# Patient Record
Sex: Female | Born: 1935 | Race: Black or African American | Hispanic: No | Marital: Single | Smoking: Never smoker
Health system: Southern US, Community
[De-identification: ages and names within clinical notes are randomized; demographics above are authoritative.]

---

## 2018-08-21 ENCOUNTER — Emergency Department (INDEPENDENT_AMBULATORY_CARE_PROVIDER_SITE_OTHER): Payer: Medicare Other

## 2018-08-21 ENCOUNTER — Emergency Department
Admission: EM | Admit: 2018-08-21 | Discharge: 2018-08-21 | Disposition: A | Discharge status: Home / Self Care | Payer: Medicare Other | Source: Home / Self Care | Attending: Family Medicine | Admitting: Family Medicine

## 2018-08-21 ENCOUNTER — Other Ambulatory Visit: Payer: Self-pay

## 2018-08-21 DIAGNOSIS — S92422A Displaced fracture of distal phalanx of left great toe, initial encounter for closed fracture: Secondary | ICD-10-CM | POA: Diagnosis not present

## 2018-08-21 DIAGNOSIS — X58XXXA Exposure to other specified factors, initial encounter: Secondary | ICD-10-CM | POA: Diagnosis not present

## 2018-08-21 DIAGNOSIS — S92425A Nondisplaced fracture of distal phalanx of left great toe, initial encounter for closed fracture: Secondary | ICD-10-CM

## 2018-08-21 MED ORDER — DOXYCYCLINE HYCLATE 100 MG PO CAPS: 100.0000 mg | ORAL_CAPSULE | Freq: Two times a day (BID) | ORAL | 0 refills | Status: AC

## 2018-08-21 MED ORDER — HYDROCODONE-ACETAMINOPHEN 5-325 MG PO TABS: ORAL_TABLET | ORAL | 0 refills | Status: AC

## 2018-08-21 NOTE — ED Provider Notes (Signed)
[facility]    CSN: [id number] Arrival date & time: 08/21/18  1531     History   Chief Complaint Chief Complaint  Patient presents with  . Leg Pain  . Foot Pain    HPI Veronica Ramos is a 82 y.o. female.   Patient complains of swelling in her left great toe and believes that she may have unknowingly injured her toe.  She has peripheral neuropathy, and denies pain in her great toe but does have vague discomfort in her left foot which has kept her awake despite oxycodone.  The history is provided by the patient and a relative.  Leg Pain  Location:  Toe and foot Time since incident:  1 week Foot location:  L foot Toe location:  L great toe Pain details:    Quality:  Aching   Radiates to:  Does not radiate   Severity:  Mild   Onset quality:  Gradual   Duration:  1 week   Timing:  Constant   Progression:  Unchanged Chronicity:  New Dislocation: no   Prior injury to area:  No Relieved by:  None tried Ineffective treatments:  None tried Associated symptoms: numbness and swelling   Associated symptoms: no back pain, no decreased ROM, no fatigue, no fever, no itching, no muscle weakness, no stiffness and no tingling   Risk factors: obesity   Foot Pain  Pertinent negatives include no chest pain and no shortness of breath.    No past medical history on file.  There are no active problems to display for this patient.     OB History   None      Home Medications    Prior to Admission medications   Medication Sig Start Date End Date Taking? Authorizing Provider  amLODipine (NORVASC) 5 MG tablet Take 5 mg by mouth daily.   Yes [provider]  buPROPion (WELLBUTRIN) 100 MG tablet Take 100 mg by mouth 2 (two) times daily.   Yes [provider]  furosemide (LASIX) 40 MG tablet Take 40 mg by mouth daily.   Yes [provider]  gabapentin (NEURONTIN) 100 MG capsule Take 200 mg by mouth daily.   Yes [provider]    hydrochlorothiazide (HYDRODIURIL) 25 MG tablet Take 25 mg by mouth daily.   Yes [provider]  insulin aspart (NOVOLOG) 100 UNIT/ML injection Inject into the skin 3 (three) times daily before meals.   Yes [provider]  insulin aspart protamine- aspart (NOVOLOG MIX 70/30) (70-30) 100 UNIT/ML injection Inject 35 Units into the skin daily with breakfast.   Yes [provider]  levothyroxine (SYNTHROID, LEVOTHROID) 175 MCG tablet Take 175 mcg by mouth daily before breakfast.   Yes [provider]  loratadine (CLARITIN) 10 MG tablet Take 10 mg by mouth daily.   Yes [provider]  metoprolol succinate (TOPROL-XL) 100 MG 24 hr tablet Take 100 mg by mouth daily. Take with or immediately following a meal.   Yes [provider]  pantoprazole (PROTONIX) 40 MG tablet Take 40 mg by mouth daily.   Yes [provider]  potassium chloride (KLOR-CON) 20 MEQ packet Take 40 mEq by mouth daily.   Yes [provider]  Rivaroxaban (XARELTO) 15 MG TABS tablet Take 15 mg by mouth 2 (two) times daily with a meal.   Yes [provider]  simvastatin (ZOCOR) 20 MG tablet Take 20 mg by mouth daily.   Yes [provider]  venlafaxine [facility])  50 MG tablet Take 50 mg by mouth 2 (two) times daily.   Yes [provider]  doxycycline (VIBRAMYCIN) 100 MG capsule Take 1 capsule (100 mg total) by mouth 2 (two) times daily. Take with food. 08/21/18   Lattie Haw, MD  HYDROcodone-acetaminophen (NORCO/VICODIN) 5-325 MG tablet Take one by mouth at bedtime as needed for pain.  May repeat in 4 to 6 hours. 08/21/18   Lattie Haw, MD    Family History No family history on file.  Social History Social History   Tobacco Use  . Smoking status: Not on file  Substance Use Topics  . Alcohol use: Not on file  . Drug use: Not on file     Allergies   Patient has no known allergies.   Review of Systems Review of Systems   Constitutional: Negative for fatigue and fever.  Respiratory: Negative for cough, chest tightness, shortness of breath and wheezing.   Cardiovascular: Positive for leg swelling. Negative for chest pain and palpitations.  Musculoskeletal: Negative for back pain and stiffness.  Skin: Negative for itching.  All other systems reviewed and are negative.    Physical Exam Triage Vital Signs ED Triage Vitals  Enc Vitals Group     BP 08/21/18 1613 (!) 186/94     Pulse Rate 08/21/18 1613 95     Resp 08/21/18 1613 18     Temp 08/21/18 1613 98.3 F (36.8 C)     Temp Source 08/21/18 1613 Oral     SpO2 08/21/18 1613 96 %     Weight 08/21/18 1614 275 lb (124.7 kg)     Height 08/21/18 1614 5\' 8"  (1.727 m)     Head Circumference --      Peak Flow --      Pain Score 08/21/18 1614 3     Pain Loc --      Pain Edu? --      Excl. in GC? --    No data found.  Updated Vital Signs BP (!) 186/94 (BP Location: Right Arm)   Pulse 95   Temp 98.3 F (36.8 C) (Oral)   Resp 18   Ht 5\' 8"  (1.727 m)   Wt 124.7 kg   SpO2 96%   BMI 41.81 kg/m   Visual Acuity Right Eye Distance:   Left Eye Distance:   Bilateral Distance:    Right Eye Near:   Left Eye Near:    Bilateral Near:     Physical Exam  Constitutional: She appears well-developed and well-nourished. No distress.  HENT:  Head: Atraumatic.  Eyes: Pupils are equal, round, and reactive to light.  Cardiovascular: Normal rate.  Pulmonary/Chest: Effort normal.  Musculoskeletal:       Feet:  Left great toe has mild swelling and ecchymosis IP joint and distal phalanx.  Nail is intact.  Cap refill present.  Vague mild tenderness to palpation over foot.  Neurological: She is alert.  Skin: Skin is warm and dry.  Nursing note and vitals reviewed.    UC Treatments / Results  Labs (all labs ordered are listed, but only abnormal results are displayed) Labs Reviewed - No data to display  EKG None  Radiology Dg Foot Complete  Left  Addendum Date: 08/21/2018   ADDENDUM REPORT: 08/21/2018 17:45 ADDENDUM: Upon further review, on the oblique projection there is a mildly comminuted intra-articular fracture at the base of the first distal phalanx. This was discussed by phone with Dr. Cathren Harsh on 08/21/2018 at 5:44 PM.  Electronically Signed   By: Trudie Reed M.D.   On: 08/21/2018 17:45   Result Date: 08/21/2018 CLINICAL DATA:  82 year old female with history of left-sided foot pain and swelling with bruising for 1 week. EXAM: LEFT FOOT - COMPLETE 3+ VIEW COMPARISON:  No priors. FINDINGS: There is no evidence of fracture or dislocation. Old healed fracture at the base of the fifth metatarsal incidentally noted. There is no evidence of arthropathy or other focal bone abnormality. Diffuse soft tissue swelling in the left foot. IMPRESSION: 1. Diffuse soft tissue swelling in the left foot without underlying acute osseous abnormality. Electronically Signed: By: Trudie Reed M.D. On: 08/21/2018 17:30    Procedures Procedures (including critical care time)  Medications Ordered in UC Medications - No data to display  Initial Impression / Assessment and Plan / UC Course  I have reviewed the triage vital signs and the nursing notes.  Pertinent labs & imaging results that were available during my care of the patient were reviewed by me and considered in my medical decision making (see chart for details).    Dispensed post-op shoe.  Bacitracin/bandage applied (family already has mupirocin ointment at home).  Begin doxycycline for staph coverage. Rx for Lortab at night (#10, no refill).  Controlled Substance Prescriptions I have consulted the [place] Controlled Substances Registry for this patient, and feel the risk/benefit ratio today is favorable for proceeding with this prescription for a controlled substance.   Instructed to use in place of oxycodone.  Followup with Dr. Rodney Langton or Dr. Clement[facility] ([facility])  for fracture management.   Final Clinical Impressions(s) / UC Diagnoses   Final diagnoses:  Closed nondisplaced fracture of distal phalanx of left great toe, initial encounter     Discharge Instructions     Elevate leg whenever possible.  Change dressing daily and apply mupirocin ointment to wound on toe.  Keep wound clean and dry.  Return for any signs of infection (or follow-up with family doctor):  Increasing redness, swelling, pain, heat, drainage, etc. Wear hard soled post-op shoe.    ED Prescriptions    Medication Sig Dispense Auth. Provider   doxycycline (VIBRAMYCIN) 100 MG capsule Take 1 capsule (100 mg total) by mouth 2 (two) times daily. Take with food. 14 capsule Lattie Haw, MD   HYDROcodone-acetaminophen (NORCO/VICODIN) 5-325 MG tablet Take one by mouth at bedtime as needed for pain.  May repeat in 4 to 6 hours. 10 tablet Lattie Haw, MD        Lattie Haw, MD 08/22/18 2001

## 2018-08-21 NOTE — Discharge Instructions (Addendum)
Elevate leg whenever possible.  Change dressing daily and apply mupirocin ointment to wound on toe.  Keep wound clean and dry.  Return for any signs of infection (or follow-up with family doctor):  Increasing redness, swelling, pain, heat, drainage, etc. Wear hard soled post-op shoe.

## 2018-08-21 NOTE — ED Triage Notes (Signed)
Patient states her left lower lateral leg and left foot have been bothering her for a few days. Granddaughter is with her. No history of fall or wrong motion. Patient uses a walker and is ambulating fairly well.

## 2018-08-23 ENCOUNTER — Telehealth: Payer: Self-pay | Admitting: Emergency Medicine

## 2018-08-23 NOTE — Telephone Encounter (Signed)
Message left on voice mail inquiring about patient's status and encouraging patient to call with questions/concerns.  

## 2019-01-15 IMAGING — DX DG FOOT COMPLETE 3+V*L*
3 series · 3 of 3 positions shown · non-contrast
Comparison: No priors.

ADDENDUM:
Upon further review, on the oblique projection there is a mildly
comminuted intra-articular fracture at the base of the first distal
phalanx. This was discussed by phone with Dr. Monje on 08/21/2018 at
[DATE].
CLINICAL DATA: 82-year-old female with history of left-sided foot
pain and swelling with bruising for 1 week.

EXAM:
LEFT FOOT - COMPLETE 3+ VIEW

[foot ap (1 of 2)]
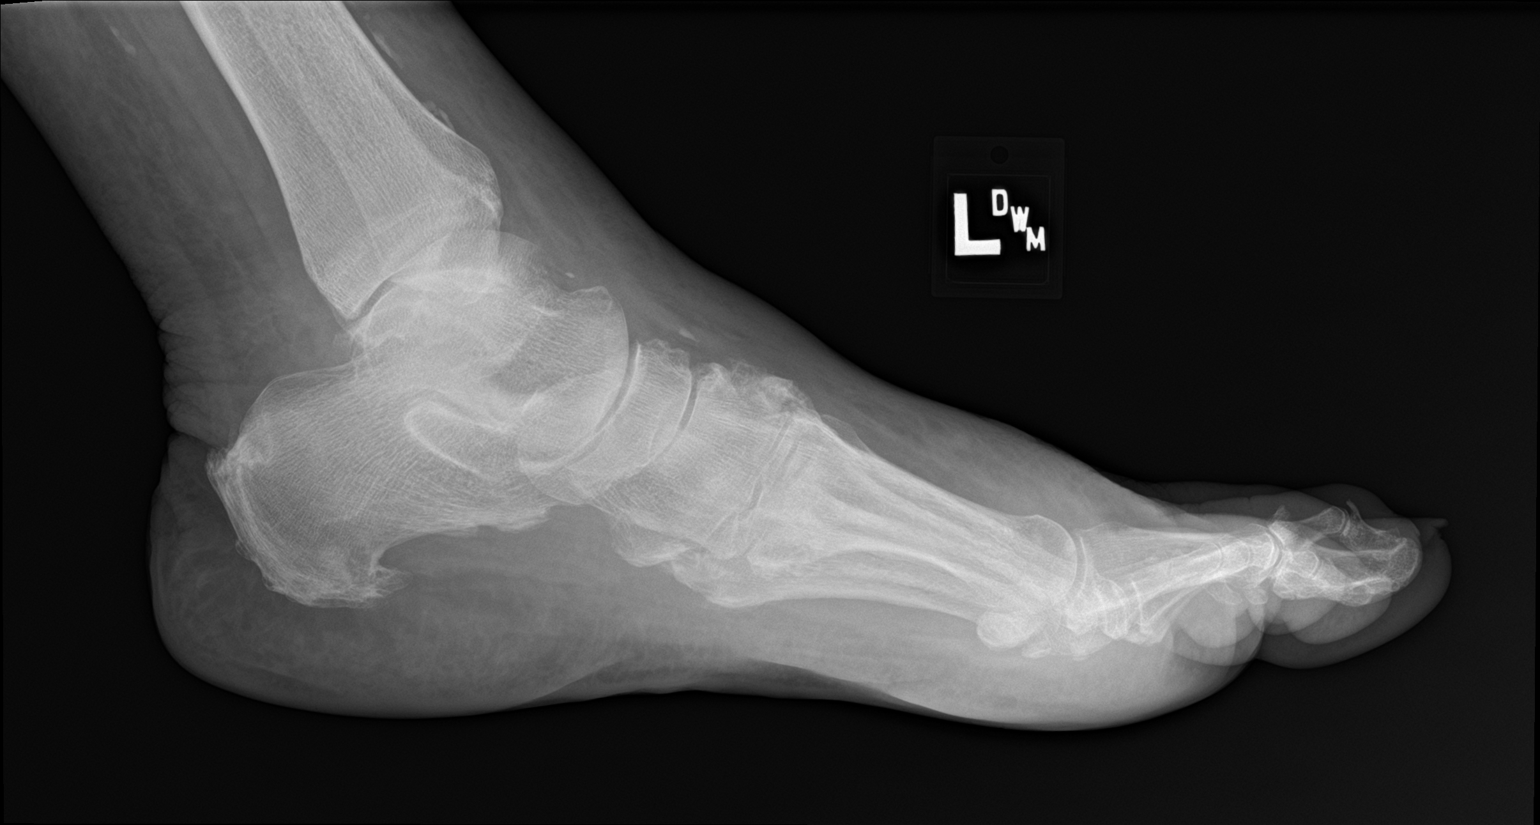

[foot obl]
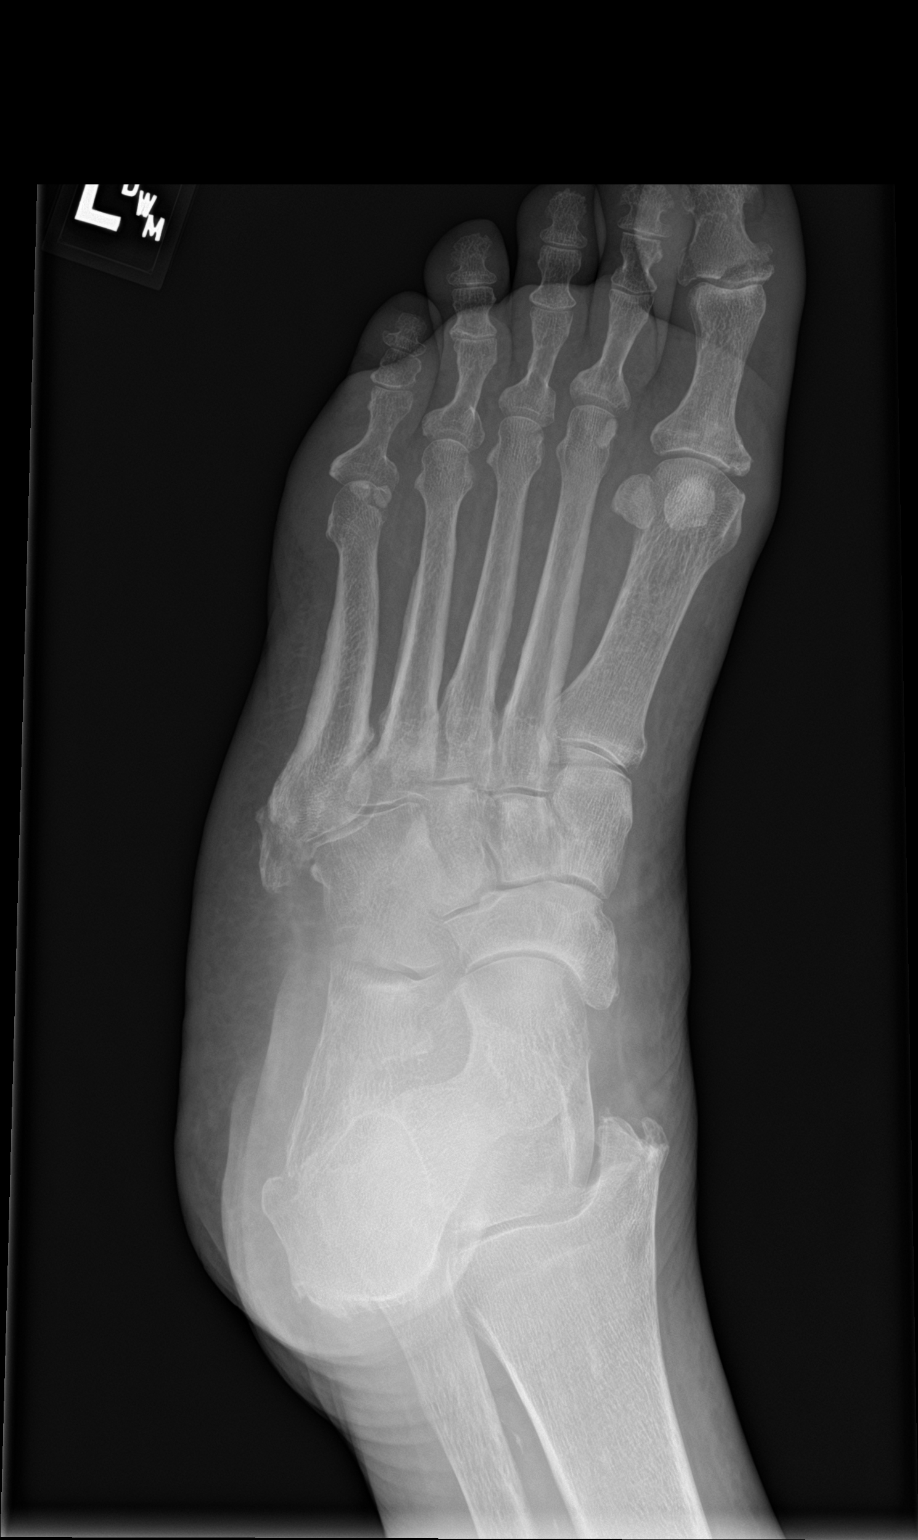

[foot ap (2 of 2)]
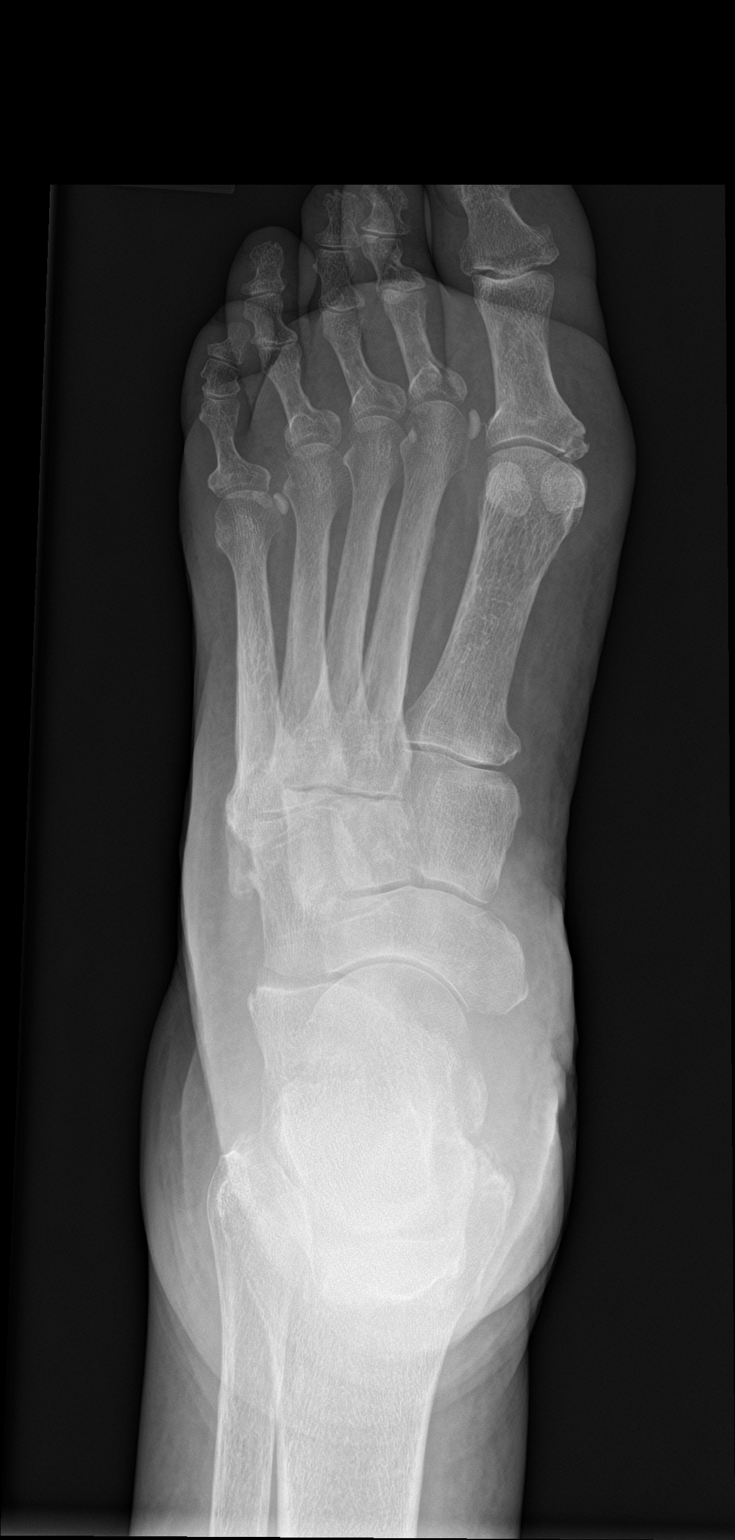

[3 of 3 positions shown; findings below may reference images not displayed]

FINDINGS: There is no evidence of fracture or dislocation. Old healed fracture
at the base of the fifth metatarsal incidentally noted. There is no
evidence of arthropathy or other focal bone abnormality. Diffuse
soft tissue swelling in the left foot.
IMPRESSION: 1. Diffuse soft tissue swelling in the left foot without underlying
acute osseous abnormality.

## 2023-09-10 ENCOUNTER — Ambulatory Visit
Admission: EM | Admit: 2023-09-10 | Discharge: 2023-09-10 | Disposition: A | Discharge status: Home / Self Care | Payer: Medicare HMO

## 2023-09-10 DIAGNOSIS — K5900 Constipation, unspecified: Secondary | ICD-10-CM | POA: Diagnosis not present

## 2023-09-10 MED ORDER — MAGNESIUM CITRATE PO SOLN: ORAL | 0 refills | Status: AC

## 2023-09-10 NOTE — ED Provider Notes (Addendum)
[facility]    CSN: [id number] Arrival date & time: 09/10/23  1513      History   Chief Complaint Chief Complaint  Patient presents with   Constipation    HPI Veronica Ramos is a 87 y.o. female.   HPI Very pleasant 87 year old female presents with constipation reports unable to move bowels for 1 week.  Denies abdominal pain currently.  PMH significant for morbid obesity, CAD, chronic diastolic heart failure, and CKD stage III.  Patient is currently on rivaroxaban and denies any unusual bleeding.  Patient is accompanied by her friend today who is awaiting in our lobby.  History reviewed. No pertinent past medical history.  There are no problems to display for this patient.   History reviewed. No pertinent surgical history.  OB History   No obstetric history on file.      Home Medications    Prior to Admission medications   Medication Sig Start Date End Date Taking? Authorizing Provider  aspirin EC 81 MG tablet Take by mouth. 08/25/23 08/24/24 Yes [provider]  bumetanide (BUMEX) 1 MG tablet Take 1 tablet by mouth daily. 07/12/23  Yes [provider]  diltiazem (CARDIZEM CD) 180 MG 24 hr capsule Take by mouth. 10/26/11  Yes [provider]  hydrALAZINE (APRESOLINE) 100 MG tablet Take by mouth. 05/01/15  Yes [provider]  insulin lispro (HUMALOG) 100 UNIT/ML injection Inject into the skin. 10/26/11  Yes [provider]  isosorbide mononitrate (IMDUR) 30 MG 24 hr tablet Take by mouth. 07/12/23  Yes [provider]  latanoprost (XALATAN) 0.005 % ophthalmic solution Place 1 drop into both eyes nightly. 10/27/11  Yes [provider]  losartan-hydrochlorothiazide (HYZAAR) 100-25 MG tablet Take 1 tablet by mouth daily. 10/26/11  Yes [provider]  magnesium citrate SOLN Take 150 mL PO twice daily for 3 days 09/10/23  Yes Trevor Iha, FNP  ranolazine (RANEXA) 500 MG 12 hr tablet Take by mouth. 07/13/23   Yes [provider]  Rivaroxaban (XARELTO) 15 MG TABS tablet Take by mouth. 09/07/23  Yes [provider]  traZODone (DESYREL) 50 MG tablet Take by mouth. 05/29/19  Yes [provider]  triamcinolone cream (KENALOG) 0.1 % Apply topically. 08/24/14  Yes [provider]  VENTOLIN HFA 108 (90 Base) MCG/ACT inhaler Inhale into the lungs. 08/24/14  Yes [provider]  amLODipine (NORVASC) 5 MG tablet Take 5 mg by mouth daily.    [provider]  atorvastatin (LIPITOR) 40 MG tablet Take 40 mg by mouth daily.    [provider]  buPROPion (WELLBUTRIN) 100 MG tablet Take 100 mg by mouth 2 (two) times daily.    [provider]  doxycycline (VIBRAMYCIN) 100 MG capsule Take 1 capsule (100 mg total) by mouth 2 (two) times daily. Take with food. 08/21/18   Lattie Haw, MD  furosemide (LASIX) 40 MG tablet Take 40 mg by mouth daily.    [provider]  gabapentin (NEURONTIN) 100 MG capsule Take 200 mg by mouth daily.    [provider]  hydrochlorothiazide (HYDRODIURIL) 25 MG tablet Take 25 mg by mouth daily.    [provider]  HYDROcodone-acetaminophen (NORCO/VICODIN) 5-325 MG tablet Take one by mouth at bedtime as needed for pain.  May repeat in 4 to 6 hours. 08/21/18   Lattie Haw, MD  insulin aspart (NOVOLOG) 100 UNIT/ML injection Inject into the skin 3 (three) times daily before meals.    [provider]  insulin aspart protamine- aspart (NOVOLOG MIX 70/30) (70-30) 100 UNIT/ML injection Inject 35 Units into the skin daily with breakfast.    [provider]  levothyroxine (SYNTHROID, LEVOTHROID) 175 MCG tablet Take 175 mcg by mouth daily before breakfast.    [provider]  loratadine (CLARITIN) 10 MG tablet Take 10 mg by mouth daily.    [provider]  metoprolol succinate (TOPROL-XL) 100 MG 24 hr tablet Take 100 mg by mouth daily. Take with or immediately following a meal.     [provider]  metoprolol tartrate (LOPRESSOR) 100 MG tablet Take 100 mg by mouth 2 (two) times daily.    [provider]  omeprazole (PRILOSEC) 20 MG capsule Take by mouth.    [provider]  pantoprazole (PROTONIX) 40 MG tablet Take 40 mg by mouth daily.    [provider]  potassium chloride (KLOR-CON) 20 MEQ packet Take 40 mEq by mouth daily.    [provider]  Rivaroxaban (XARELTO) 15 MG TABS tablet Take 15 mg by mouth 2 (two) times daily with a meal.    [provider]  simvastatin (ZOCOR) 20 MG tablet Take 20 mg by mouth daily.    [provider]  venlafaxine (EFFEXOR) 50 MG tablet Take 50 mg by mouth 2 (two) times daily.    [provider]    Family History History reviewed. No pertinent family history.  Social History Social History   Tobacco Use   Smoking status: Never   Smokeless tobacco: Never     Allergies   Patient has no known allergies.   Review of Systems Review of Systems  Gastrointestinal:  Positive for constipation.     Physical Exam Triage Vital Signs ED Triage Vitals  Encounter Vitals Group     BP 09/10/23 1536 (!) 140/85     Systolic BP Percentile --      Diastolic BP Percentile --      Pulse Rate 09/10/23 1536 86     Resp 09/10/23 1536 16     Temp 09/10/23 1536 98.6 F (37 C)     Temp Source 09/10/23 1536 Oral     SpO2 09/10/23 1536 96 %     Weight --      Height --      Head Circumference --      Peak Flow --      Pain Score 09/10/23 1532 0     Pain Loc --      Pain Education --      Exclude from Growth Chart --    No data found.  Updated Vital Signs BP (!) 140/85 (BP Location: Right Arm)   Pulse 86   Temp 98.6 F (37 C) (Oral)   Resp 16   SpO2 96%    Physical Exam Vitals and nursing note reviewed.  Constitutional:      General: She is not in acute distress.    Appearance: Normal appearance. She is obese. She is not ill-appearing.  HENT:      Head: Normocephalic and atraumatic.     Mouth/Throat:     Mouth: Mucous membranes are moist.     Pharynx: Oropharynx is clear.  Eyes:     Extraocular Movements: Extraocular movements intact.     Conjunctiva/sclera: Conjunctivae normal.     Pupils: Pupils are equal, round, and reactive to light.  Cardiovascular:     Rate and Rhythm: Normal rate and regular rhythm.     Pulses:  Normal pulses.     Heart sounds: Normal heart sounds.  Pulmonary:     Effort: Pulmonary effort is normal.     Breath sounds: Normal breath sounds. No wheezing, rhonchi or rales.  Musculoskeletal:        General: Normal range of motion.     Cervical back: Normal range of motion and neck supple. No tenderness.  Lymphadenopathy:     Cervical: No cervical adenopathy.  Skin:    General: Skin is warm and dry.  Neurological:     General: No focal deficit present.     Mental Status: She is alert and oriented to person, place, and time.     Comments: Patient ambulating with 4 point rolling walker, taken to car via wheelchair by staff today following this office visit  Psychiatric:        Mood and Affect: Mood normal.        Behavior: Behavior normal.        Thought Content: Thought content normal.      UC Treatments / Results  Labs (all labs ordered are listed, but only abnormal results are displayed) Labs Reviewed - No data to display  EKG   Radiology No results found.  Procedures Procedures (including critical care time)  Medications Ordered in UC Medications - No data to display  Initial Impression / Assessment and Plan / UC Course  I have reviewed the triage vital signs and the nursing notes.  Pertinent labs & imaging results that were available during my care of the patient were reviewed by me and considered in my medical decision making (see chart for details).     MDM: 1.  Constipation, unspecified type-Rx'd mag citrate solution: Take 150 mL p.o. twice daily x 3 days. Advised patient to  take medication as directed.  Encouraged to increase daily water intake to 64 ounces per day 7 days/week.  Encouraged patient to increase daily fiber intake to 60 to 80 g/day.  Advised patient if unable to have bowel movement in the next 24 hours please go to nearest ED for further evaluation.  Patient discharged home, hemodynamically stable. Final Clinical Impressions(s) / UC Diagnoses   Final diagnoses:  Constipation, unspecified constipation type     Discharge Instructions      Advised patient to take medication as directed.  Encouraged to increase daily water intake to 64 ounces per day 7 days/week.  Encouraged patient to increase daily fiber intake to 60 to 80 g/day.  Advised patient if unable to have bowel movement in the next 24 hours please go to nearest ED for further evaluation.     ED Prescriptions     Medication Sig Dispense Auth. Provider   magnesium citrate SOLN Take 150 mL PO twice daily for 3 days 900 mL Trevor Iha, FNP      PDMP not reviewed this encounter.   Trevor Iha, FNP 09/10/23 1611    Trevor Iha, FNP 09/10/23 1655

## 2023-09-10 NOTE — ED Triage Notes (Signed)
Pt with constipation. States she has been unable to have a BM x about one week. Denies abd pain, N/V. Home remedies:

## 2023-09-10 NOTE — Discharge Instructions (Addendum)
Advised patient to take medication as directed.  Encouraged to increase daily water intake to 64 ounces per day 7 days/week.  Encouraged patient to increase daily fiber intake to 60 to 80 g/day.  Advised patient if unable to have bowel movement in the next 24 hours please go to nearest ED for further evaluation.
